# Patient Record
Sex: Male | Born: 1960 | Race: White | Hispanic: No | Marital: Married | Smoking: Never smoker
Health system: Southern US, Community
[De-identification: ages and names within clinical notes are randomized; demographics above are authoritative.]

---

## 1998-05-05 ENCOUNTER — Emergency Department (HOSPITAL_COMMUNITY): Admission: EM | Admit: 1998-05-05 | Discharge: 1998-05-05 | Payer: Self-pay | Admitting: Emergency Medicine

## 2020-03-19 ENCOUNTER — Encounter (HOSPITAL_BASED_OUTPATIENT_CLINIC_OR_DEPARTMENT_OTHER): Payer: Self-pay | Admitting: *Deleted

## 2020-03-19 ENCOUNTER — Emergency Department (HOSPITAL_BASED_OUTPATIENT_CLINIC_OR_DEPARTMENT_OTHER)
Admission: EM | Admit: 2020-03-19 | Discharge: 2020-03-20 | Disposition: A | Discharge status: Home / Self Care | Payer: Commercial Managed Care - PPO | Attending: Emergency Medicine | Admitting: Emergency Medicine

## 2020-03-19 ENCOUNTER — Other Ambulatory Visit: Payer: Self-pay

## 2020-03-19 DIAGNOSIS — R109 Unspecified abdominal pain: Secondary | ICD-10-CM | POA: Insufficient documentation

## 2020-03-19 DIAGNOSIS — R339 Retention of urine, unspecified: Secondary | ICD-10-CM | POA: Diagnosis present

## 2020-03-19 DIAGNOSIS — R300 Dysuria: Secondary | ICD-10-CM | POA: Insufficient documentation

## 2020-03-19 LAB — URINALYSIS, ROUTINE W REFLEX MICROSCOPIC
Bilirubin Urine: NEGATIVE
Glucose, UA: NEGATIVE mg/dL
Ketones, ur: 15 mg/dL — AB
Leukocytes,Ua: NEGATIVE
Nitrite: NEGATIVE
Protein, ur: NEGATIVE mg/dL
Specific Gravity, Urine: 1.01 (ref 1.005–1.030)
pH: 5.5 (ref 5.0–8.0)

## 2020-03-19 LAB — URINALYSIS, MICROSCOPIC (REFLEX): WBC, UA: NONE SEEN WBC/hpf (ref 0–5)

## 2020-03-19 NOTE — ED Triage Notes (Signed)
Urinary retention. Prostate problems in the past.

## 2020-03-20 ENCOUNTER — Emergency Department (HOSPITAL_BASED_OUTPATIENT_CLINIC_OR_DEPARTMENT_OTHER): Payer: Commercial Managed Care - PPO

## 2020-03-20 LAB — CBC WITH DIFFERENTIAL/PLATELET
Abs Immature Granulocytes: 0.02 10*3/uL (ref 0.00–0.07)
Basophils Absolute: 0 10*3/uL (ref 0.0–0.1)
Basophils Relative: 0 %
Eosinophils Absolute: 0 10*3/uL (ref 0.0–0.5)
Eosinophils Relative: 1 %
HCT: 43.9 % (ref 39.0–52.0)
Hemoglobin: 15.3 g/dL (ref 13.0–17.0)
Immature Granulocytes: 0 %
Lymphocytes Relative: 12 %
Lymphs Abs: 0.8 10*3/uL (ref 0.7–4.0)
MCH: 30 pg (ref 26.0–34.0)
MCHC: 34.9 g/dL (ref 30.0–36.0)
MCV: 86.1 fL (ref 80.0–100.0)
Monocytes Absolute: 0.2 10*3/uL (ref 0.1–1.0)
Monocytes Relative: 4 %
Neutro Abs: 5.6 10*3/uL (ref 1.7–7.7)
Neutrophils Relative %: 83 %
Platelets: 176 10*3/uL (ref 150–400)
RBC: 5.1 MIL/uL (ref 4.22–5.81)
RDW: 11.8 % (ref 11.5–15.5)
WBC: 6.7 10*3/uL (ref 4.0–10.5)
nRBC: 0 % (ref 0.0–0.2)

## 2020-03-20 LAB — BASIC METABOLIC PANEL
Anion gap: 11 (ref 5–15)
BUN: 15 mg/dL (ref 8–23)
CO2: 23 mmol/L (ref 22–32)
Calcium: 8.8 mg/dL — ABNORMAL LOW (ref 8.9–10.3)
Chloride: 102 mmol/L (ref 98–111)
Creatinine, Ser: 0.9 mg/dL (ref 0.61–1.24)
GFR calc Af Amer: >60 mL/min (ref >60)
GFR calc non Af Amer: >60 mL/min (ref >60)
Glucose, Bld: 110 mg/dL — ABNORMAL HIGH (ref 70–99)
Potassium: 4.1 mmol/L (ref 3.5–5.1)
Sodium: 136 mmol/L (ref 135–145)

## 2020-03-20 MED ORDER — TAMSULOSIN HCL 0.4 MG PO CAPS: 0.4000 mg | ORAL_CAPSULE | Freq: Every day | ORAL | 0 refills | Status: DC

## 2020-03-20 NOTE — ED Provider Notes (Signed)
[facility] EMERGENCY DEPARTMENT Provider Note   CSN: [id number] Arrival date & time: 03/19/20  2040     History Chief Complaint  Patient presents with  . Urinary Retention    Keith Manning is a 59 y.o. male.  Patient here with lower abdominal pain and not able to urinate since approximately 5 PM.  States he woke up from a nap and felt he had to urinate but nothing would come out except for dribbles.  He had severe pelvic pain and pressure.  He has had prostate problems in the past with elevated PSA but told his MRI and biopsies were negative.  He has never needed a catheter in the past.  He denies any nausea or vomiting.  Denies any fever.  Did have a bowel movement earlier today and believes he has been regular.  No chest pain or shortness of breath.  No fever.  No pain in the testicles.  Foley catheter was placed prior to my evaluation and drained approximately 800 cc of blood-tinged urine.  His pain is since resolved.  The history is provided by the patient.       History reviewed. No pertinent past medical history.  There are no problems to display for this patient.   History reviewed. No pertinent surgical history.     No family history on file.  Social History   Tobacco Use  . Smoking status: Never Smoker  . Smokeless tobacco: Never Used  Substance Use Topics  . Alcohol use: Never  . Drug use: Never    Home Medications Prior to Admission medications   Not on File    Allergies    Penicillin g and Cefdinir  Review of Systems   Review of Systems  Constitutional: Negative for activity change, appetite change and fever.  HENT: Negative for congestion and rhinorrhea.   Eyes: Negative for visual disturbance.  Respiratory: Negative for chest tightness and shortness of breath.   Cardiovascular: Negative for chest pain.  Gastrointestinal: Positive for abdominal pain. Negative for nausea and vomiting.  Genitourinary: Positive for difficulty  urinating, dysuria, hematuria and urgency.  Musculoskeletal: Negative for arthralgias, back pain and myalgias.  Skin: Negative for rash.  Neurological: Negative for dizziness, weakness and headaches.   all other systems are negative except as noted in the HPI and PMH.    Physical Exam Updated Vital Signs BP (!) 134/86 (BP Location: Right Arm)   Pulse 78   Temp 98.2 F (36.8 C) (Oral)   Resp 17   Ht 5\' 9"  (1.753 m)   Wt 84.8 kg   SpO2 99%   BMI 27.62 kg/m   Physical Exam Vitals and nursing note reviewed.  Constitutional:      General: He is not in acute distress.    Appearance: He is well-developed.  HENT:     Head: Normocephalic and atraumatic.     Mouth/Throat:     Pharynx: No oropharyngeal exudate.  Eyes:     Conjunctiva/sclera: Conjunctivae normal.     Pupils: Pupils are equal, round, and reactive to light.  Neck:     Comments: No meningismus. Cardiovascular:     Rate and Rhythm: Normal rate and regular rhythm.     Heart sounds: Normal heart sounds. No murmur heard.   Pulmonary:     Effort: Pulmonary effort is normal. No respiratory distress.     Breath sounds: Normal breath sounds.  Abdominal:     Palpations: Abdomen is soft.     Tenderness: There  is abdominal tenderness. There is no guarding or rebound.     Comments: Reducible ventral hernia.  Mild suprapubic tenderness, no guarding or rebound  Genitourinary:    Comments: Foley catheter in place draining blood-tinged urine.  No testicular tenderness Few clots visualized in catheter tubing Musculoskeletal:        General: No tenderness. Normal range of motion.     Cervical back: Normal range of motion and neck supple.  Skin:    General: Skin is warm.  Neurological:     Mental Status: He is alert and oriented to person, place, and time.     Cranial Nerves: No cranial nerve deficit.     Motor: No abnormal muscle tone.     Coordination: Coordination normal.     Comments: No ataxia on finger to nose  bilaterally. No pronator drift. 5/5 strength throughout. CN 2-12 intact.Equal grip strength. Sensation intact.   Psychiatric:        Behavior: Behavior normal.     ED Results / Procedures / Treatments   Labs (all labs ordered are listed, but only abnormal results are displayed) Labs Reviewed  URINALYSIS, ROUTINE W REFLEX MICROSCOPIC - Abnormal; Notable for the following components:      Result Value   Hgb urine dipstick SMALL (*)    Ketones, ur 15 (*)    All other components within normal limits  URINALYSIS, MICROSCOPIC (REFLEX) - Abnormal; Notable for the following components:   Bacteria, UA RARE (*)    All other components within normal limits  BASIC METABOLIC PANEL - Abnormal; Notable for the following components:   Glucose, Bld 110 (*)    Calcium 8.8 (*)    All other components within normal limits  URINE CULTURE  CBC WITH DIFFERENTIAL/PLATELET    EKG None  Radiology CT Renal Stone Study  Result Date: 03/20/2020 CLINICAL DATA:  Urinary retention and flank pain EXAM: CT ABDOMEN AND PELVIS WITHOUT CONTRAST TECHNIQUE: Multidetector CT imaging of the abdomen and pelvis was performed following the standard protocol without IV contrast. COMPARISON:  None. FINDINGS: Lower chest: Mild atelectatic changes are noted. Hepatobiliary: Liver shows scattered hypodensities consistent with cysts. The gallbladder is within normal limits. Pancreas: Unremarkable. No pancreatic ductal dilatation or surrounding inflammatory changes. Spleen: Normal in size without focal abnormality. Adrenals/Urinary Tract: Adrenal glands are within normal limits. Kidneys are well visualized bilaterally. No renal calculi or urinary tract obstructive changes are seen. A few scattered cysts are noted. Bladder is decompressed by Foley catheter. Stomach/Bowel: Colon shows no obstructive or inflammatory changes. The appendix is within normal limits. Small bowel is unremarkable. The stomach is decompressed. Vascular/Lymphatic:  No significant vascular findings are present. No enlarged abdominal or pelvic lymph nodes. Reproductive: Prostate is unremarkable. Other: Fat containing left inguinal hernia is noted. No free fluid is noted. Musculoskeletal: Degenerative changes of the lumbar spine. 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 IMPRESSION: Chronic changes without acute abnormality Electronically Signed   By: Alcide Clever M.D.   On: 03/20/2020 00:48    Procedures Procedures (including critical care time)  Medications Ordered in ED Medications - No data to display  ED Course  I have reviewed the triage vital signs and the nursing notes.  Pertinent labs & imaging results that were available during my care of the patient were reviewed by me and considered in my medical decision making (see chart for details).    MDM Rules/Calculators/A&P  Patient with pelvic pain and urinary retention.  Now improved after Foley catheter placed.  No fever. Bladder scan >700 initially before catheter placement.  Urinalysis was sent and culture is sent.  Urinalysis does not show obvious infection.  Culture is sent.  Labs show stable hemoglobin and creatinine. CT scan shows no obstructive pathology.  We will discharge with Foley catheter in place and refer to urology.  Return to the ED with worsening pain, unable to urinate, fever, vomiting, other concerns. Final Clinical Impression(s) / ED Diagnoses Final diagnoses:  Urinary retention    Rx / DC Orders ED Discharge Orders    None       Kenita Bines, Jeannett Senior, MD 03/20/20 [phone]

## 2020-03-20 NOTE — ED Notes (Signed)
PT bladder scanned at 734 ML

## 2020-03-20 NOTE — Discharge Instructions (Signed)
Keep the catheter in place and follow-up with the urologist.  Return to the ED with worsening pain, fever, vomiting, unable to urinate or any other concerns.

## 2020-03-21 ENCOUNTER — Emergency Department (HOSPITAL_BASED_OUTPATIENT_CLINIC_OR_DEPARTMENT_OTHER)
Admission: EM | Admit: 2020-03-21 | Discharge: 2020-03-21 | Disposition: A | Discharge status: Home / Self Care | Payer: Commercial Managed Care - PPO | Attending: Emergency Medicine | Admitting: Emergency Medicine

## 2020-03-21 ENCOUNTER — Other Ambulatory Visit: Payer: Self-pay

## 2020-03-21 ENCOUNTER — Encounter (HOSPITAL_BASED_OUTPATIENT_CLINIC_OR_DEPARTMENT_OTHER): Payer: Self-pay

## 2020-03-21 DIAGNOSIS — R509 Fever, unspecified: Secondary | ICD-10-CM | POA: Diagnosis not present

## 2020-03-21 LAB — URINE CULTURE: Culture: NO GROWTH

## 2020-03-21 LAB — URINALYSIS, ROUTINE W REFLEX MICROSCOPIC
Bilirubin Urine: NEGATIVE
Glucose, UA: NEGATIVE mg/dL
Ketones, ur: NEGATIVE mg/dL
Nitrite: NEGATIVE
Protein, ur: 100 mg/dL — AB
Specific Gravity, Urine: >1.03 — ABNORMAL HIGH (ref 1.005–1.030)
pH: 6 (ref 5.0–8.0)

## 2020-03-21 LAB — URINALYSIS, MICROSCOPIC (REFLEX): RBC / HPF: >50 RBC/hpf (ref 0–5)

## 2020-03-21 MED ORDER — ACETAMINOPHEN 325 MG PO TABS
ORAL_TABLET | ORAL | Status: AC
  Administered 2020-03-21: 650 mg via ORAL
  Filled 2020-03-21: qty 2

## 2020-03-21 MED ORDER — ACETAMINOPHEN 325 MG PO TABS: 650.0000 mg | ORAL_TABLET | Freq: Once | ORAL | Status: AC

## 2020-03-21 NOTE — ED Provider Notes (Signed)
[facility] EMERGENCY DEPARTMENT Provider Note   CSN: [id number] Arrival date & time: 03/21/20  2000     History Chief Complaint  Patient presents with  . Fever    Keith Manning is a 59 y.o. male.  Patient with body aches and fever.  Had first coronavirus vaccine yesterday.  Had an indwelling Foley catheter placed 2 days ago for urinary retention.  Was told to come back to the ED if he developed a fever.  He suspects that his symptoms are secondary to vaccination.  He has not had any problems with his Foley but he thought maybe he would need his urine checked again.  The history is provided by the patient.  Fever Severity:  Mild Onset quality:  Gradual Timing:  Intermittent Progression:  Waxing and waning Chronicity:  New Relieved by:  Nothing Worsened by:  Nothing Associated symptoms: chills   Associated symptoms: no chest pain, no dysuria and no rash        History reviewed. No pertinent past medical history.  There are no problems to display for this patient.   History reviewed. No pertinent surgical history.     No family history on file.  Social History   Tobacco Use  . Smoking status: Never Smoker  . Smokeless tobacco: Never Used  Vaping Use  . Vaping Use: Never used  Substance Use Topics  . Alcohol use: Never  . Drug use: Never    Home Medications Prior to Admission medications   Medication Sig Start Date End Date Taking? Authorizing Provider  tamsulosin (FLOMAX) 0.4 MG CAPS capsule Take 1 capsule (0.4 mg total) by mouth daily. 03/20/20   Rancour, Jeannett Senior, MD    Allergies    Penicillin g and Cefdinir  Review of Systems   Review of Systems  Constitutional: Positive for chills and fever.  Cardiovascular: Negative for chest pain.  Genitourinary: Negative for decreased urine volume, difficulty urinating, discharge, dysuria, flank pain, hematuria, testicular pain and urgency.  Skin: Negative for color change, pallor and rash.     Physical Exam Updated Vital Signs BP 124/81 (BP Location: Left Arm)   Pulse 70   Temp 99.1 F (37.3 C) (Oral)   Resp 16   SpO2 96%   Physical Exam Constitutional:      General: He is not in acute distress.    Appearance: He is not ill-appearing.  HENT:     Head: Normocephalic and atraumatic.  Cardiovascular:     Pulses: Normal pulses.  Pulmonary:     Effort: Pulmonary effort is normal.     Breath sounds: Normal breath sounds. No wheezing.  Neurological:     Mental Status: He is alert.     ED Results / Procedures / Treatments   Labs (all labs ordered are listed, but only abnormal results are displayed) Labs Reviewed  URINALYSIS, ROUTINE W REFLEX MICROSCOPIC - Abnormal; Notable for the following components:      Result Value   APPearance CLOUDY (*)    Specific Gravity, Urine >1.030 (*)    Hgb urine dipstick LARGE (*)    Protein, ur 100 (*)    Leukocytes,Ua TRACE (*)    All other components within normal limits  URINALYSIS, MICROSCOPIC (REFLEX) - Abnormal; Notable for the following components:   Bacteria, UA RARE (*)    All other components within normal limits  URINE CULTURE    EKG None  Radiology CT Renal Stone Study  Result Date: 03/20/2020 CLINICAL DATA:  Urinary retention and  flank pain EXAM: CT ABDOMEN AND PELVIS WITHOUT CONTRAST TECHNIQUE: Multidetector CT imaging of the abdomen and pelvis was performed following the standard protocol without IV contrast. COMPARISON:  None. FINDINGS: Lower chest: Mild atelectatic changes are noted. Hepatobiliary: Liver shows scattered hypodensities consistent with cysts. The gallbladder is within normal limits. Pancreas: Unremarkable. No pancreatic ductal dilatation or surrounding inflammatory changes. Spleen: Normal in size without focal abnormality. Adrenals/Urinary Tract: Adrenal glands are within normal limits. Kidneys are well visualized bilaterally. No renal calculi or urinary tract obstructive changes are seen. A few  scattered cysts are noted. Bladder is decompressed by Foley catheter. Stomach/Bowel: Colon shows no obstructive or inflammatory changes. The appendix is within normal limits. Small bowel is unremarkable. The stomach is decompressed. Vascular/Lymphatic: No significant vascular findings are present. No enlarged abdominal or pelvic lymph nodes. Reproductive: Prostate is unremarkable. Other: Fat containing left inguinal hernia is noted. No free fluid is noted. Musculoskeletal: Degenerative changes of the lumbar spine. 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 IMPRESSION: Chronic changes without acute abnormality Electronically Signed   By: Alcide Clever M.D.   On: 03/20/2020 00:48    Procedures Procedures (including critical care time)  Medications Ordered in ED Medications  acetaminophen (TYLENOL) tablet 650 mg (650 mg Oral Given 03/21/20 2048)    ED Course  I have reviewed the triage vital signs and the nursing notes.  Pertinent labs & imaging results that were available during my care of the patient were reviewed by me and considered in my medical decision making (see chart for details).    MDM Rules/Calculators/A&P                          Keith Manning is a 59 year old male here for fever, body aches.  Normal vitals upon arrival.  Had Covid vaccination yesterday and suspects that he has symptoms secondary to vaccination however did have chronic indwelling Foley catheter placed 2 days ago and was told to return if he develops a fever.  Urinalysis was sent and was overall unremarkable.  We will send urine culture and after shared decision with the patient will hold off any antibiotics until urine culture is back.  Suspect urinalysis likely not a clean-catch.  Patient overall appears well.  Does not have any abdominal pain.  Recommend continued use of Tylenol Motrin for pain.  Told to return this symptoms persist.  Discharged in good condition.  This chart  was dictated using voice recognition software.  Despite best efforts to proofread,  errors can occur which can change the documentation meaning.   Final Clinical Impression(s) / ED Diagnoses Final diagnoses:  Fever, unspecified fever cause    Rx / DC Orders ED Discharge Orders    None       Virgina Norfolk, DO 03/21/20 2143

## 2020-03-21 NOTE — ED Notes (Signed)
RN in to discharge patient, pt already gone. Left without d/c paperwork

## 2020-03-21 NOTE — ED Triage Notes (Signed)
Pt c/o fever, chills x today-states he was seen recently for urinary retention-foley in place that is draining-pt states he received Moderna vaccine #1 yesterday-NAD-steady gait

## 2020-03-21 NOTE — Discharge Instructions (Addendum)
We will follow-up urine culture.  Feel free to look it up online yourself as well.  Please return to the ED if symptoms worsen as well.

## 2020-03-23 LAB — URINE CULTURE: Culture: 10000 — AB

## 2020-03-24 ENCOUNTER — Telehealth: Payer: Self-pay | Admitting: Emergency Medicine

## 2020-03-24 NOTE — Telephone Encounter (Signed)
Post ED Visit - Positive Culture Follow-up  Culture report reviewed by antimicrobial stewardship pharmacist: [facility] []  , Pharm.D. []  Enzo Bi, .D., BCPS AQ-ID []  Celedonio Miyamoto, Pharm.D., BCPS []  [address], .D., BCPS []  [place], .D., BCPS, AAHIVP []  Georgina Pillion, Pharm.D., BCPS, AAHIVP []  [address], PharmD, BCPS []  , PharmD, BCPS []  [place], PharmD, BCPS [x]  [address], PharmD []  , PharmD, BCPS []  Estella Husk, PharmD  Pharmacy Team []  Lysle Pearl, PharmD []  , PharmD []  Phillips Climes, PharmD []  , Rph []  Agapito Games) , PharmD []  Luisa Hart, PharmD []  , PharmD []  Mervyn Gay, PharmD []  , PharmD []  Vinnie Level, PharmD []  [facility], PharmD []  , PharmD []  Len Childs, PharmD   Positive urine culture No further patient follow-up is required at this time.  Zakkery Dorian 03/24/2020, 2:28 PM

## 2020-03-31 ENCOUNTER — Emergency Department (HOSPITAL_BASED_OUTPATIENT_CLINIC_OR_DEPARTMENT_OTHER)
Admission: EM | Admit: 2020-03-31 | Discharge: 2020-03-31 | Disposition: A | Discharge status: Home / Self Care | Payer: Commercial Managed Care - PPO | Attending: Emergency Medicine | Admitting: Emergency Medicine

## 2020-03-31 ENCOUNTER — Other Ambulatory Visit: Payer: Self-pay

## 2020-03-31 ENCOUNTER — Encounter (HOSPITAL_BASED_OUTPATIENT_CLINIC_OR_DEPARTMENT_OTHER): Payer: Self-pay | Admitting: Emergency Medicine

## 2020-03-31 DIAGNOSIS — R339 Retention of urine, unspecified: Secondary | ICD-10-CM | POA: Diagnosis not present

## 2020-03-31 DIAGNOSIS — R140 Abdominal distension (gaseous): Secondary | ICD-10-CM | POA: Insufficient documentation

## 2020-03-31 DIAGNOSIS — N329 Bladder disorder, unspecified: Secondary | ICD-10-CM | POA: Diagnosis not present

## 2020-03-31 LAB — URINALYSIS, ROUTINE W REFLEX MICROSCOPIC
Bilirubin Urine: NEGATIVE
Glucose, UA: NEGATIVE mg/dL
Ketones, ur: NEGATIVE mg/dL
Leukocytes,Ua: NEGATIVE
Nitrite: NEGATIVE
Protein, ur: NEGATIVE mg/dL
Specific Gravity, Urine: 1.02 (ref 1.005–1.030)
pH: 6 (ref 5.0–8.0)

## 2020-03-31 LAB — URINALYSIS, MICROSCOPIC (REFLEX)

## 2020-03-31 LAB — BASIC METABOLIC PANEL
Anion gap: 9 (ref 5–15)
BUN: 21 mg/dL (ref 8–23)
CO2: 24 mmol/L (ref 22–32)
Calcium: 8.8 mg/dL — ABNORMAL LOW (ref 8.9–10.3)
Chloride: 104 mmol/L (ref 98–111)
Creatinine, Ser: 1.07 mg/dL (ref 0.61–1.24)
GFR calc Af Amer: >60 mL/min (ref >60)
GFR calc non Af Amer: >60 mL/min (ref >60)
Glucose, Bld: 132 mg/dL — ABNORMAL HIGH (ref 70–99)
Potassium: 4.1 mmol/L (ref 3.5–5.1)
Sodium: 137 mmol/L (ref 135–145)

## 2020-03-31 LAB — CBC WITH DIFFERENTIAL/PLATELET
Abs Immature Granulocytes: 0.02 10*3/uL (ref 0.00–0.07)
Basophils Absolute: 0 10*3/uL (ref 0.0–0.1)
Basophils Relative: 1 %
Eosinophils Absolute: 0.1 10*3/uL (ref 0.0–0.5)
Eosinophils Relative: 3 %
HCT: 42.7 % (ref 39.0–52.0)
Hemoglobin: 14.9 g/dL (ref 13.0–17.0)
Immature Granulocytes: 0 %
Lymphocytes Relative: 23 %
Lymphs Abs: 1.2 10*3/uL (ref 0.7–4.0)
MCH: 30.3 pg (ref 26.0–34.0)
MCHC: 34.9 g/dL (ref 30.0–36.0)
MCV: 86.8 fL (ref 80.0–100.0)
Monocytes Absolute: 0.5 10*3/uL (ref 0.1–1.0)
Monocytes Relative: 9 %
Neutro Abs: 3.5 10*3/uL (ref 1.7–7.7)
Neutrophils Relative %: 64 %
Platelets: 194 10*3/uL (ref 150–400)
RBC: 4.92 MIL/uL (ref 4.22–5.81)
RDW: 11.9 % (ref 11.5–15.5)
WBC: 5.4 10*3/uL (ref 4.0–10.5)
nRBC: 0 % (ref 0.0–0.2)

## 2020-03-31 MED ORDER — TAMSULOSIN HCL 0.4 MG PO CAPS: 0.4000 mg | ORAL_CAPSULE | Freq: Every day | ORAL | 0 refills | Status: AC

## 2020-03-31 NOTE — ED Triage Notes (Signed)
Pt states he had foley cath removed Friday am, initially able to void, but states he hasn't been able to void for the last 2 hours.

## 2020-03-31 NOTE — ED Provider Notes (Signed)
[facility] EMERGENCY DEPARTMENT Provider Note   CSN: [id number] Arrival date & time: 03/31/20  0445     History Chief Complaint  Patient presents with  . urine retention    Keith Manning is a 59 y.o. male.  The history is provided by the patient.  Illness Location:  Bladder  Quality:  Inability  Severity:  Severe Onset quality:  Gradual Duration:  5 hours Timing:  Constant Progression:  Worsening Chronicity:  Recurrent Context:  Has had urinary retention in the past  Relieved by:  Nothing  Worsened by:  Time Ineffective treatments:  None Associated symptoms: no chest pain, no congestion, no rash and no shortness of breath   Associated symptoms comment:  Abdominal distention  Risk factors:  BPH       History reviewed. No pertinent past medical history.  There are no problems to display for this patient.   History reviewed. No pertinent surgical history.     History reviewed. No pertinent family history.  Social History   Tobacco Use  . Smoking status: Never Smoker  . Smokeless tobacco: Never Used  Vaping Use  . Vaping Use: Never used  Substance Use Topics  . Alcohol use: Never  . Drug use: Never    Home Medications Prior to Admission medications   Medication Sig Start Date End Date Taking? Authorizing Provider  tamsulosin (FLOMAX) 0.4 MG CAPS capsule Take 1 capsule (0.4 mg total) by mouth daily. 03/31/20   Javares Kaufhold, MD    Allergies    Penicillin g and Cefdinir  Review of Systems   Review of Systems  Constitutional: Negative for unexpected weight change.  HENT: Negative for congestion.   Respiratory: Negative for shortness of breath.   Cardiovascular: Negative for chest pain.  Gastrointestinal: Positive for abdominal distention.  Genitourinary: Positive for difficulty urinating.  Musculoskeletal: Negative for arthralgias.  Skin: Negative for rash.  Psychiatric/Behavioral: Negative for agitation.  All other systems reviewed  and are negative.   Physical Exam Updated Vital Signs BP (!) 151/92 (BP Location: Right Arm)   Pulse 65   Temp 97.8 F (36.6 C)   Resp 20   Ht 5\' 9"  (1.753 m)   Wt 83.9 kg   SpO2 98%   BMI 27.32 kg/m   Physical Exam Vitals and nursing note reviewed.  Constitutional:      General: He is not in acute distress.    Appearance: Normal appearance.  HENT:     Head: Normocephalic and atraumatic.     Nose: Nose normal.  Eyes:     Conjunctiva/sclera: Conjunctivae normal.     Pupils: Pupils are equal, round, and reactive to light.  Cardiovascular:     Rate and Rhythm: Normal rate and regular rhythm.     Pulses: Normal pulses.     Heart sounds: Normal heart sounds.  Pulmonary:     Effort: Pulmonary effort is normal.     Breath sounds: Normal breath sounds.  Abdominal:     General: Abdomen is flat. There is distension.     Tenderness: There is no abdominal tenderness. There is no guarding or rebound.     Comments: Bladder palpable in mid abdomen   Musculoskeletal:        General: Normal range of motion.     Cervical back: Normal range of motion and neck supple.  Skin:    General: Skin is warm and dry.     Capillary Refill: Capillary refill takes less than 2 seconds.  Neurological:  General: No focal deficit present.     Mental Status: He is alert and oriented to person, place, and time.     Deep Tendon Reflexes: Reflexes normal.  Psychiatric:        Mood and Affect: Mood normal.        Behavior: Behavior normal.     ED Results / Procedures / Treatments   Labs (all labs ordered are listed, but only abnormal results are displayed) Results for orders placed or performed during the hospital encounter of 03/31/20  CBC with Differential/Platelet  Result Value Ref Range   WBC 5.4 4.0 - 10.5 K/uL   RBC 4.92 4.22 - 5.81 MIL/uL   Hemoglobin 14.9 13.0 - 17.0 g/dL   HCT 50.0 39 - 52 %   MCV 86.8 80.0 - 100.0 fL   MCH 30.3 26.0 - 34.0 pg   MCHC 34.9 30.0 - 36.0 g/dL   RDW  93.8 18.2 - 99.3 %   Platelets 194 150 - 400 K/uL   nRBC 0.0 0.0 - 0.2 %   Neutrophils Relative % 64 %   Neutro Abs 3.5 1.7 - 7.7 K/uL   Lymphocytes Relative 23 %   Lymphs Abs 1.2 0.7 - 4.0 K/uL   Monocytes Relative 9 %   Monocytes Absolute 0.5 0 - 1 K/uL   Eosinophils Relative 3 %   Eosinophils Absolute 0.1 0 - 0 K/uL   Basophils Relative 1 %   Basophils Absolute 0.0 0 - 0 K/uL   Immature Granulocytes 0 %   Abs Immature Granulocytes 0.02 0.00 - 0.07 K/uL  Basic metabolic panel  Result Value Ref Range   Sodium 137 135 - 145 mmol/L   Potassium 4.1 3.5 - 5.1 mmol/L   Chloride 104 98 - 111 mmol/L   CO2 24 22 - 32 mmol/L   Glucose, Bld 132 (H) 70 - 99 mg/dL   BUN 21 8 - 23 mg/dL   Creatinine, Ser 7.16 0.61 - 1.24 mg/dL   Calcium 8.8 (L) 8.9 - 10.3 mg/dL   GFR calc non Af Amer >60 >60 mL/min   GFR calc Af Amer >60 >60 mL/min   Anion gap 9 5 - 15  Urinalysis, Routine w reflex microscopic Urine, Catheterized  Result Value Ref Range   Color, Urine YELLOW YELLOW   APPearance CLEAR CLEAR   Specific Gravity, Urine 1.020 1.005 - 1.030   pH 6.0 5.0 - 8.0   Glucose, UA NEGATIVE NEGATIVE mg/dL   Hgb urine dipstick LARGE (A) NEGATIVE   Bilirubin Urine NEGATIVE NEGATIVE   Ketones, ur NEGATIVE NEGATIVE mg/dL   Protein, ur NEGATIVE NEGATIVE mg/dL   Nitrite NEGATIVE NEGATIVE   Leukocytes,Ua NEGATIVE NEGATIVE  Urinalysis, Microscopic (reflex)  Result Value Ref Range   RBC / HPF 11-20 0 - 5 RBC/hpf   WBC, UA 0-5 0 - 5 WBC/hpf   Bacteria, UA FEW (A) NONE SEEN   Squamous Epithelial / LPF 0-5 0 - 5   CT Renal Stone Study  Result Date: 03/20/2020 CLINICAL DATA:  Urinary retention and flank pain EXAM: CT ABDOMEN AND PELVIS WITHOUT CONTRAST TECHNIQUE: Multidetector CT imaging of the abdomen and pelvis was performed following the standard protocol without IV contrast. COMPARISON:  None. FINDINGS: Lower chest: Mild atelectatic changes are noted. Hepatobiliary: Liver shows scattered hypodensities  consistent with cysts. The gallbladder is within normal limits. Pancreas: Unremarkable. No pancreatic ductal dilatation or surrounding inflammatory changes. Spleen: Normal in size without focal abnormality. Adrenals/Urinary Tract: Adrenal glands are within normal  limits. Kidneys are well visualized bilaterally. No renal calculi or urinary tract obstructive changes are seen. A few scattered cysts are noted. Bladder is decompressed by Foley catheter. Stomach/Bowel: Colon shows no obstructive or inflammatory changes. The appendix is within normal limits. Small bowel is unremarkable. The stomach is decompressed. Vascular/Lymphatic: No significant vascular findings are present. No enlarged abdominal or pelvic lymph nodes. Reproductive: Prostate is unremarkable. Other: Fat containing left inguinal hernia is noted. No free fluid is noted. Musculoskeletal: Degenerative changes of the lumbar spine. 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 IMPRESSION: Chronic changes without acute abnormality Electronically Signed   By: Alcide Clever M.D.   On: 03/20/2020 00:48    None  Radiology No results found.  Procedures Procedures (including critical care time)  Medications Ordered in ED Medications - No data to display  ED Course  I have reviewed the triage vital signs and the nursing notes.  Pertinent labs & imaging results that were available during my care of the patient were reviewed by me and considered in my medical decision making (see chart for details).    Foley placed with drainage of more than 500 cc of urine.  Normal Creatinine.  Stable for discharge with follow up in 7 days by patient's urologist. One month supply of flomax sent to pharmacy.    Keith Manning was evaluated in Emergency Department on 03/31/2020 for the symptoms described in the history of present illness. He was evaluated in the context of the global COVID-19 pandemic, which necessitated  consideration that the patient might be at risk for infection with the SARS-CoV-2 virus that causes COVID-19. Institutional protocols and algorithms that pertain to the evaluation of patients at risk for COVID-19 are in a state of rapid change based on information released by regulatory bodies including the CDC and federal and state organizations. These policies and algorithms were followed during the patient's care in the ED. Final Clinical Impression(s) / ED Diagnoses Final diagnoses:  Urinary retention  Return for intractable cough, coughing up blood,fevers >100.4 unrelieved by medication, shortness of breath, intractable vomiting, chest pain, shortness of breath, weakness,numbness, changes in speech, facial asymmetry,abdominal pain, passing out,Inability to tolerate liquids or food, cough, altered mental status or any concerns. No signs of systemic illness or infection. The patient is nontoxic-appearing on exam and vital signs are within normal limits.   I have reviewed the triage vital signs and the nursing notes. Pertinent labs &imaging results that were available during my care of the patient were reviewed by me and considered in my medical decision making (see chart for details).After history, exam, and medical workup I feel the patient has beenappropriately medically screened and is safe for discharge home. Pertinent diagnoses were discussed with the patient. Patient was given return precautions.     Rx / DC Orders ED Discharge Orders         Ordered    tamsulosin (FLOMAX) 0.4 MG CAPS capsule  Daily     Discontinue  Reprint     03/31/20 0516           Derrin Currey, MD 03/31/20 4235

## 2020-03-31 NOTE — Discharge Instructions (Addendum)
Follow up with your uology in 7 days for voiding trial

## 2020-08-15 ENCOUNTER — Other Ambulatory Visit: Payer: Self-pay | Admitting: Urology

## 2020-08-15 DIAGNOSIS — R972 Elevated prostate specific antigen [PSA]: Secondary | ICD-10-CM

## 2020-09-12 ENCOUNTER — Ambulatory Visit
Admission: RE | Admit: 2020-09-12 | Discharge: 2020-09-12 | Disposition: A | Discharge status: Home / Self Care | Payer: Commercial Managed Care - PPO | Source: Ambulatory Visit | Attending: Urology | Admitting: Urology

## 2020-09-12 ENCOUNTER — Other Ambulatory Visit: Payer: Self-pay

## 2020-09-12 DIAGNOSIS — R972 Elevated prostate specific antigen [PSA]: Secondary | ICD-10-CM

## 2020-09-12 MED ORDER — GADOBENATE DIMEGLUMINE 529 MG/ML IV SOLN
17.0000 mL | Freq: Once | INTRAVENOUS | Status: AC | PRN
  Administered 2020-09-12: 17 mL via INTRAVENOUS

## 2022-09-17 IMAGING — MR MR PROSTATE WO/W CM
12 series · 48 of 48 positions shown · IV contrast (17ml Multihance)
Comparison: None.

CLINICAL DATA: Elevated PSA.

EXAM:
MR PROSTATE WITHOUT AND WITH CONTRAST
TECHNIQUE: Multiplanar multisequence MRI images were obtained of the pelvis
centered about the prostate. Pre and post contrast images were
obtained.
CONTRAST:  17mL MULTIHANCE GADOBENATE DIMEGLUMINE 529 MG/ML IV SOLN

[Series 3: T2 · coronal · 3.0mm · 0.56mm/px · 1 of 26 slices shown (1 of 3)]
[im 1/26]
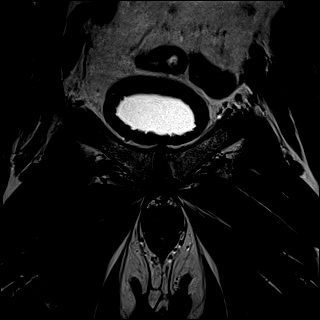

[Series 4: T1 · axial · 5.0mm · 1.25mm/px · 1 of 88 slices shown]
[im 1/88]
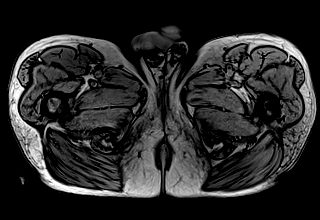

[Series 5: DWI · axial · 3.0mm · 1.75mm/px · 1 of 81 slices shown (1 of 3)]
[im 1/81]
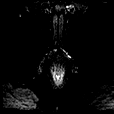

[Series 6: DWI · axial · 3.0mm · 1.75mm/px · 1 of 27 slices shown (2 of 3)]
[im 1/27]
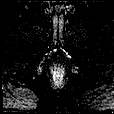

[Series 7: DWI · axial · 3.0mm · 1.75mm/px · 1 of 27 slices shown (3 of 3)]
[im 1/27]
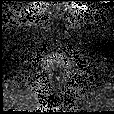

[Series 8: T2 · axial · 3.0mm · 0.56mm/px · 1 of 27 slices shown (2 of 3)]
[im 1/27]
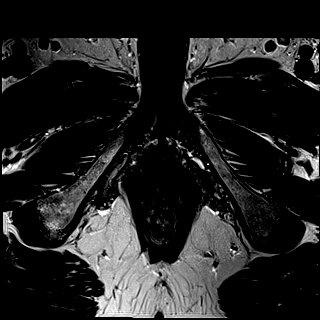

[Series 9: T2 · axial · 1.0mm · 1.04mm/px · z∈[-73,+6]mm · 2 of 80 slices shown (3 of 3)]
[im 1/80]
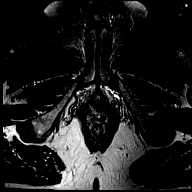
[im 80/80]
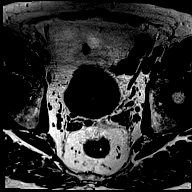

[Series 10: pre t1_twist_tra_dyn · axial · non-contrast · 3.5mm · 0.83mm/px · 1 of 24 slices shown]
[im 1/24]
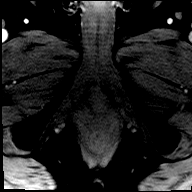

[Series 11: post t1_twist_tra_dyn-copy center · axial · non-contrast · 3.5mm · 0.83mm/px · z∈[-74,+6]mm · 18 of 720 slices shown]
[im 1/720]
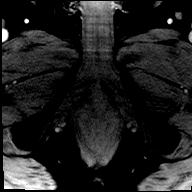
[im 43/720]
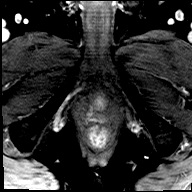
[im 85/720]
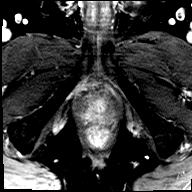
[im 127/720]
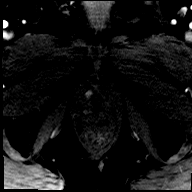
[im 170/720]
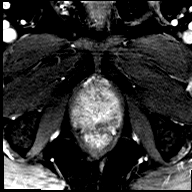
[im 212/720]
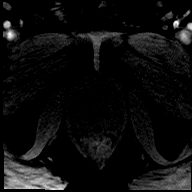
[im 254/720]
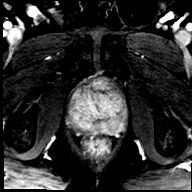
[im 297/720]
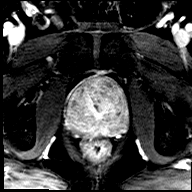
[im 339/720]
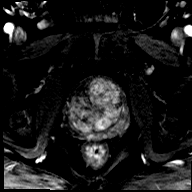
[im 381/720]
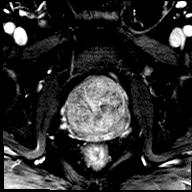
[im 423/720]
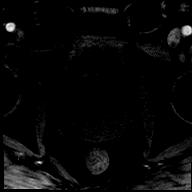
[im 466/720]
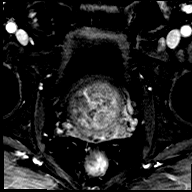
[im 508/720]
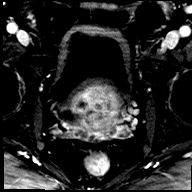
[im 550/720]
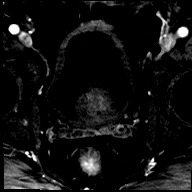
[im 593/720]
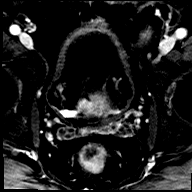
[im 635/720]
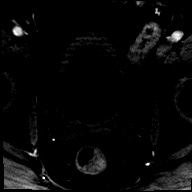
[im 677/720]
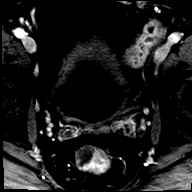
[im 720/720]
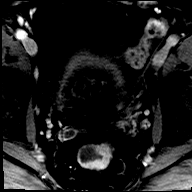

[Series 12: post t1_twist_tra_dyn-copy cent_sub · axial · 3.5mm · 0.83mm/px · z∈[-74,+6]mm · 17 of 695 slices shown]
[im 1/695]
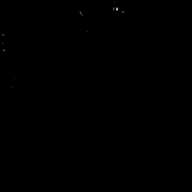
[im 44/695]
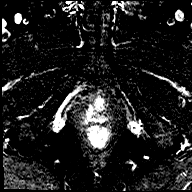
[im 87/695]
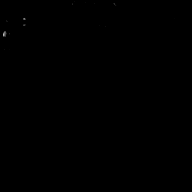
[im 131/695]
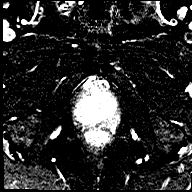
[im 174/695]
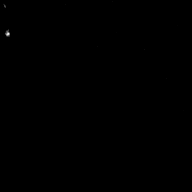
[im 217/695]
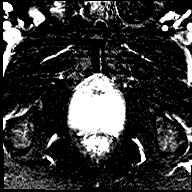
[im 261/695]
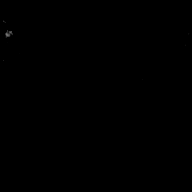
[im 304/695]
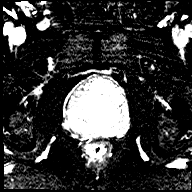
[im 348/695]
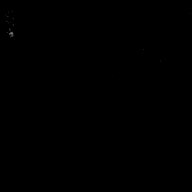
[im 391/695]
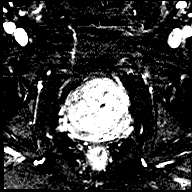
[im 434/695]
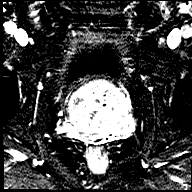
[im 478/695]
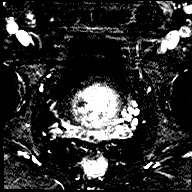
[im 521/695]
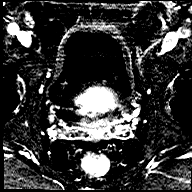
[im 564/695]
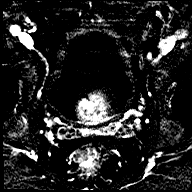
[im 608/695]
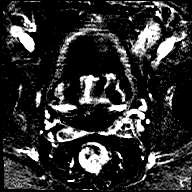
[im 651/695]
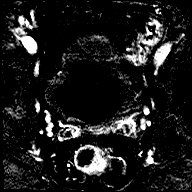
[im 695/695]
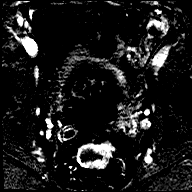

[Series 13: t1_vibe_dixon_tra_f · axial · 2.5mm · 0.91mm/px · z∈[-88,+110]mm · 2 of 80 slices shown]
[im 1/80]
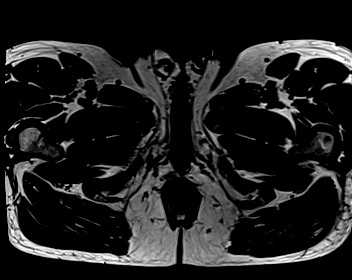
[im 80/80]
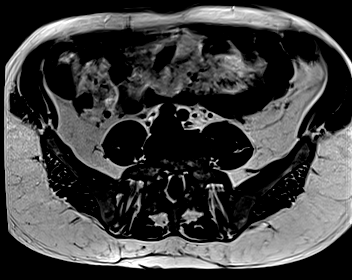

[Series 14: t1_vibe_dixon_tra_w · axial · 2.5mm · 0.91mm/px · z∈[-88,+110]mm · 2 of 80 slices shown]
[im 1/80]
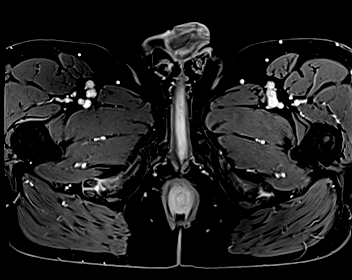
[im 80/80]
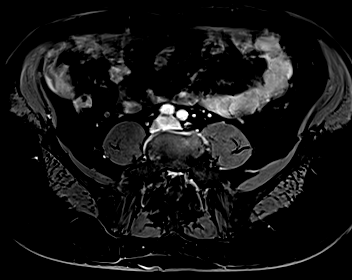

[48 of 48 positions shown; findings below may reference images not displayed]

FINDINGS: Prostate:

-- Peripheral Zone: Diffuse thinning due to central gland
hypertrophy. Linear/wedge shaped hypointensities are noted on ADC,
left side greater than right; however, no focal ADC hypointense or
high b-value DWI hyperintense nodules are identified.

-- Transition/Central Zone: Circumscribed BPH nodules are noted, but
no suspicious nodules with obscured or non-circumscribed margins
seen.

-- Measurements/Volume:  6.8 x 5.2 x 5.2 cm (volume = 96 cm^3)

Transcapsular spread:  Absent

Seminal vesicle involvement:  Absent

Neurovascular bundle involvement:  Absent

Pelvic adenopathy: None visualized

Bone metastasis: None visualized

Other:  Small left inguinal hernia is seen containing only fat.
IMPRESSION: No radiographic evidence of high-grade prostate carcinoma. PI-RADS
2: Low (clinically significant cancer is unlikely to be present)
